# Patient Record
Sex: Female | Born: 1955 | Race: White | Hispanic: Yes | Marital: Single
Health system: Southern US, Community
[De-identification: ages and names within clinical notes are randomized; demographics above are authoritative.]

---

## 2018-02-07 ENCOUNTER — Ambulatory Visit
Admission: RE | Admit: 2018-02-07 | Discharge: 2018-02-07 | Disposition: A | Discharge status: Home / Self Care | Payer: Self-pay | Source: Ambulatory Visit | Attending: Oncology | Admitting: Oncology

## 2018-02-07 ENCOUNTER — Ambulatory Visit: Payer: Self-pay | Attending: Oncology

## 2018-02-07 VITALS — BP 137/62 | HR 74 | Temp 98.3°F | Ht <= 58 in | Wt 134.0 lb

## 2018-02-07 DIAGNOSIS — Z0000 Encounter for general adult medical examination without abnormal findings: Secondary | ICD-10-CM | POA: Insufficient documentation

## 2018-02-07 NOTE — Progress Notes (Signed)
  Subjective:     Patient ID: Stacey Fleming, female   DOB: 1955/11/13, 62 y.o.   MRN: [id number]  HPI   Review of Systems     Objective:   Physical Exam  Pulmonary/Chest: Right breast exhibits no inverted nipple, no mass, no nipple discharge, no skin change and no tenderness. Left breast exhibits no inverted nipple, no mass, no nipple discharge, no skin change and no tenderness. Breasts are symmetrical.  Scar left breast from coffee spill; large pendulous breasts         Assessment:     62 year old hispanic patient presents for [facility] clinic visit. Maritza Afanador interpreted exam.  Had mammogram, and pap in [place] last year while staying with daughter.  Patient screened, and meets [facility] eligibility.  Patient does not have insurance, Medicare or Medicaid.  Handout given on Affordable Care Act. Instructed patient on breast self awareness using teach back method.  Clinical breast exam unremarkable.  No mass or lump palpated.     Plan:     Sent for bilateral screening mammogram.

## 2018-03-29 ENCOUNTER — Other Ambulatory Visit: Payer: Self-pay | Admitting: *Deleted

## 2018-03-29 DIAGNOSIS — N63 Unspecified lump in unspecified breast: Secondary | ICD-10-CM

## 2018-04-05 ENCOUNTER — Ambulatory Visit
Admission: RE | Admit: 2018-04-05 | Discharge: 2018-04-05 | Disposition: A | Discharge status: Home / Self Care | Payer: Self-pay | Source: Ambulatory Visit | Attending: Oncology | Admitting: Oncology

## 2018-04-05 DIAGNOSIS — N63 Unspecified lump in unspecified breast: Secondary | ICD-10-CM | POA: Insufficient documentation

## 2018-04-13 ENCOUNTER — Other Ambulatory Visit: Payer: Self-pay

## 2018-04-13 DIAGNOSIS — N63 Unspecified lump in unspecified breast: Secondary | ICD-10-CM

## 2018-04-17 NOTE — Progress Notes (Signed)
Risk Assessment    No risk assessment data for the current encounter   Risk Scores      02/07/2018   Last edited by: Jim LikeLambert, Sheena M, RN   5-year risk: 3.3 %   Lifetime risk: 13.6 %          Radiologist recommends 6 month follow-up mammogram, and ultrasound of left breast.  Patient scheduled for 10/08/18 at 11:00.  Mailed appointment information to patient Copy to [facility].

## 2018-10-08 ENCOUNTER — Other Ambulatory Visit: Payer: Self-pay

## 2018-10-12 ENCOUNTER — Ambulatory Visit
Admission: RE | Admit: 2018-10-12 | Discharge: 2018-10-12 | Disposition: A | Discharge status: Home / Self Care | Payer: Medicaid Other | Source: Ambulatory Visit | Attending: Oncology | Admitting: Oncology

## 2018-10-12 ENCOUNTER — Encounter: Payer: Self-pay | Admitting: Radiology

## 2018-10-12 DIAGNOSIS — N63 Unspecified lump in unspecified breast: Secondary | ICD-10-CM

## 2018-10-17 ENCOUNTER — Other Ambulatory Visit: Payer: Self-pay | Admitting: Family Medicine

## 2018-10-17 DIAGNOSIS — M810 Age-related osteoporosis without current pathological fracture: Secondary | ICD-10-CM

## 2018-11-14 ENCOUNTER — Encounter: Payer: Self-pay | Admitting: *Deleted

## 2018-11-14 ENCOUNTER — Other Ambulatory Visit: Payer: Self-pay | Admitting: Family Medicine

## 2018-11-14 NOTE — Progress Notes (Signed)
Stacey Fleming notified patient's primary care that they would need to order next 6 month follow up mammogram since patient is now 8 and has Medicaid.  Dr. Bubba Camp office is in agreement.

## 2018-11-26 ENCOUNTER — Other Ambulatory Visit: Payer: Medicaid Other

## 2019-03-04 ENCOUNTER — Other Ambulatory Visit: Payer: Self-pay | Admitting: Family Medicine

## 2019-03-04 DIAGNOSIS — Z1231 Encounter for screening mammogram for malignant neoplasm of breast: Secondary | ICD-10-CM

## 2019-05-16 ENCOUNTER — Other Ambulatory Visit: Payer: Self-pay | Admitting: Family Medicine

## 2019-05-16 DIAGNOSIS — R928 Other abnormal and inconclusive findings on diagnostic imaging of breast: Secondary | ICD-10-CM

## 2019-07-16 IMAGING — MG MM DIGITAL DIAGNOSTIC UNILAT*L* W/ TOMO W/ CAD
4 series · 4 of 12 positions shown · non-contrast
Comparison: Previous exam(s).

CLINICAL DATA: 62-year-old female recalled from baseline screening
mammogram dated 02/07/2018 for left breast masses.

EXAM:
DIGITAL DIAGNOSTIC LEFT MAMMOGRAM WITH CAD AND TOMO
ULTRASOUND LEFT BREAST

[L CC synth-2D]
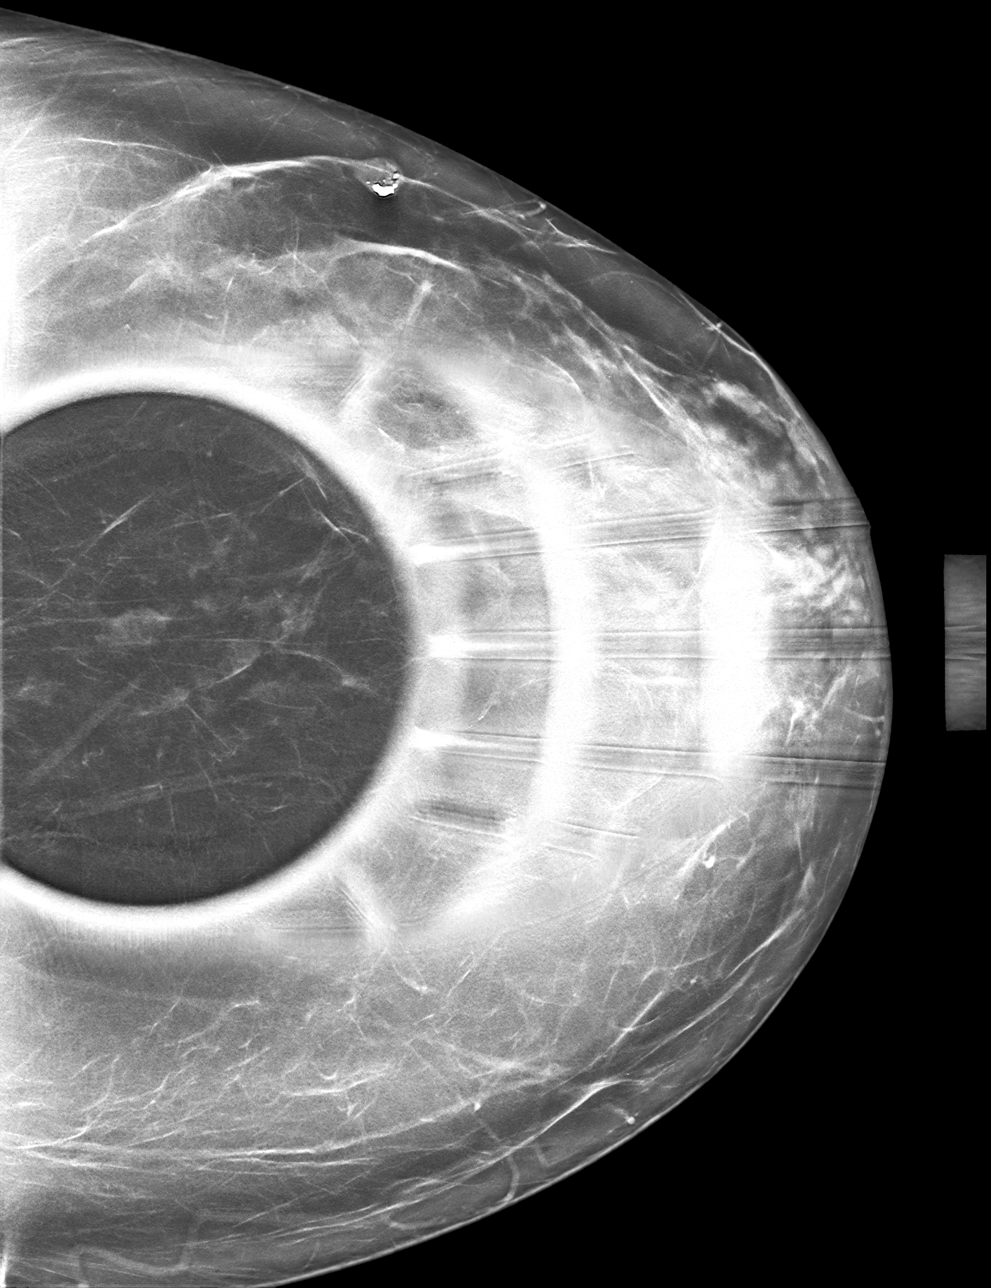

[L MLO synth-2D]
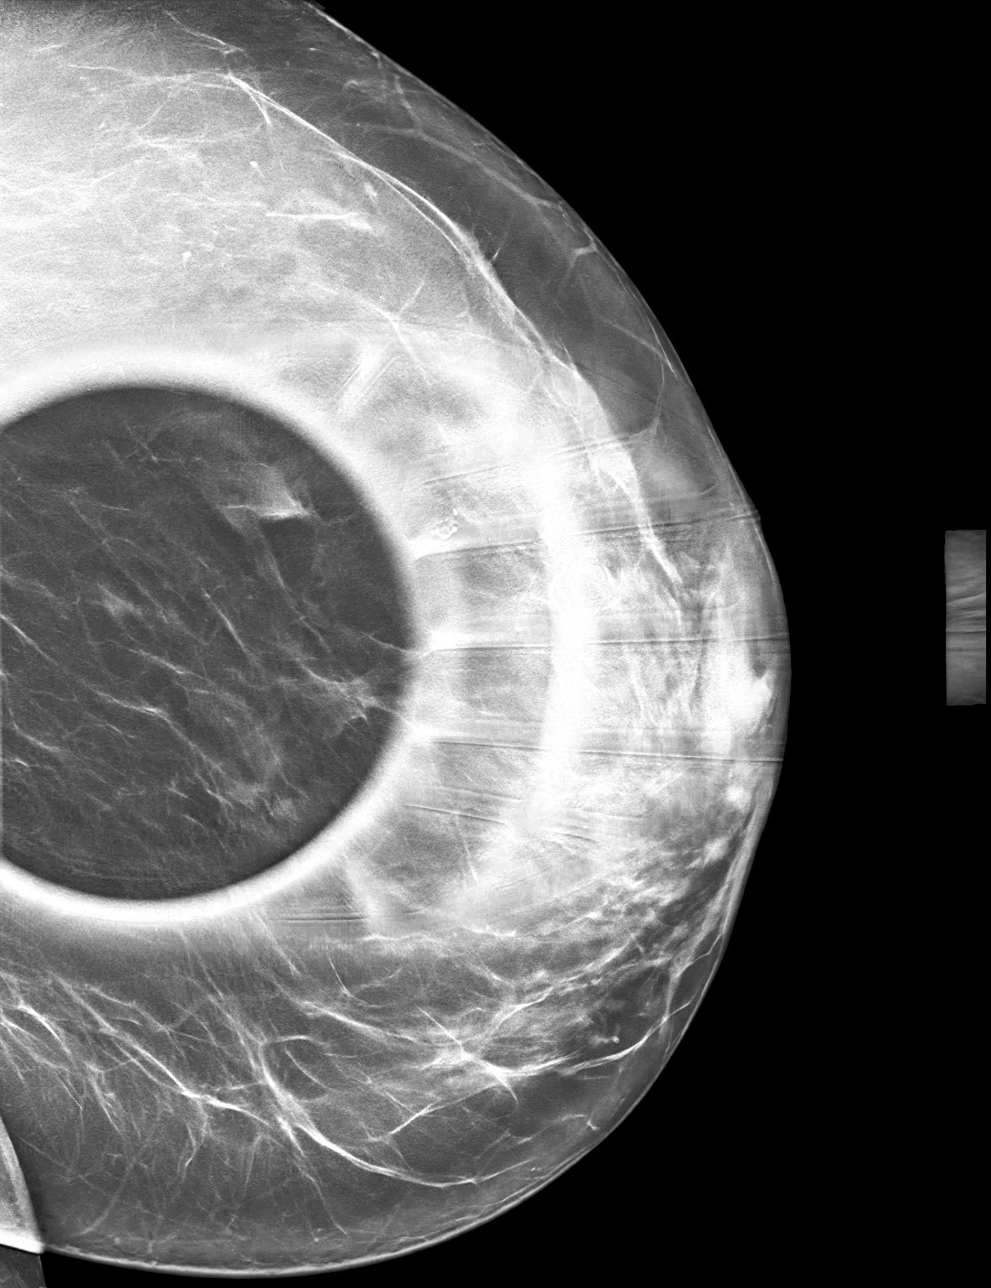

[L CC tomo · tomo slice 35/68.0]
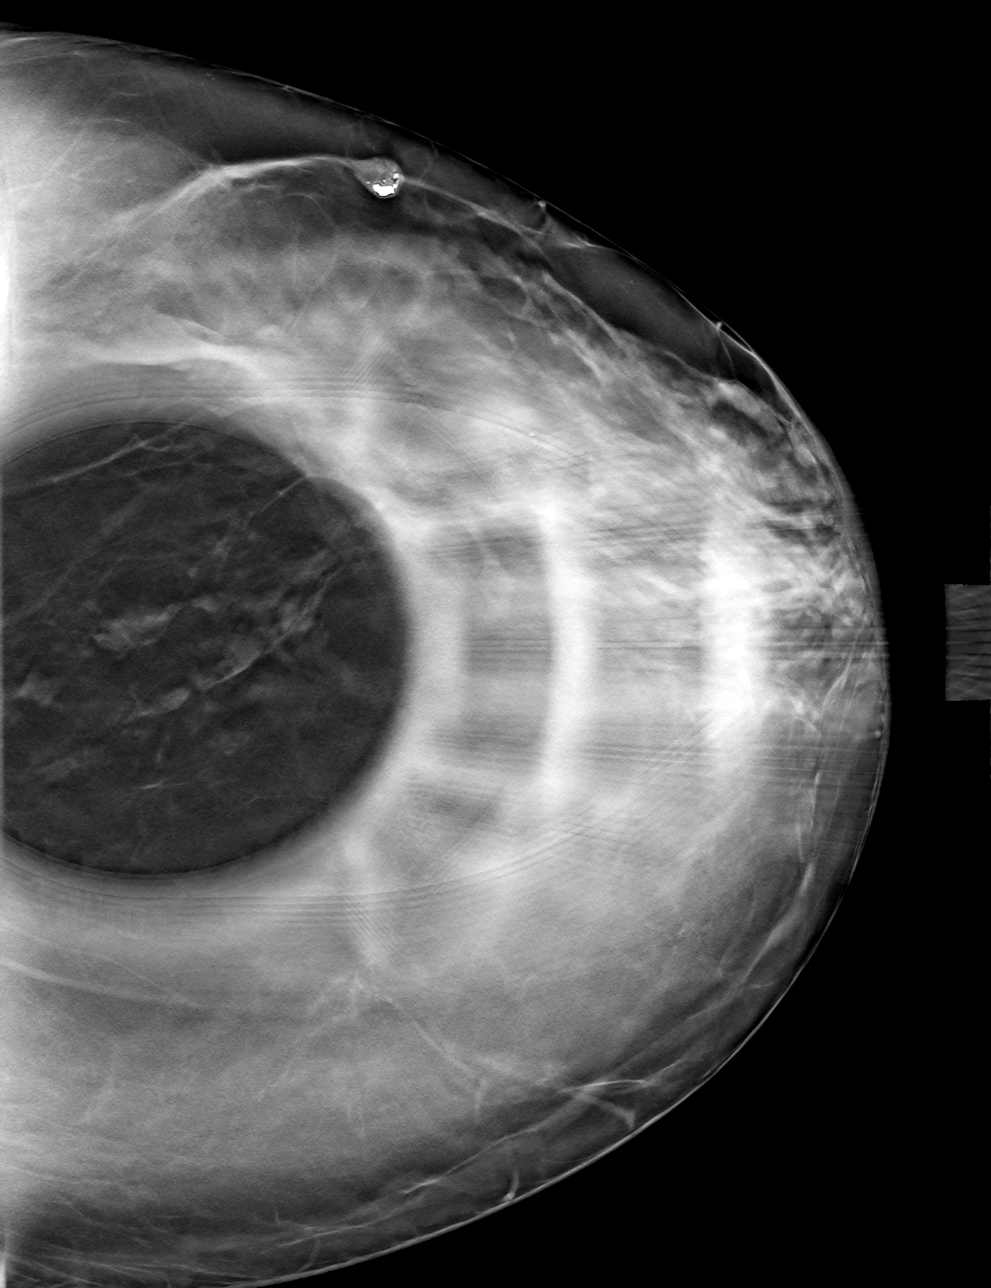

[L MLO tomo · tomo slice 35/69.0]
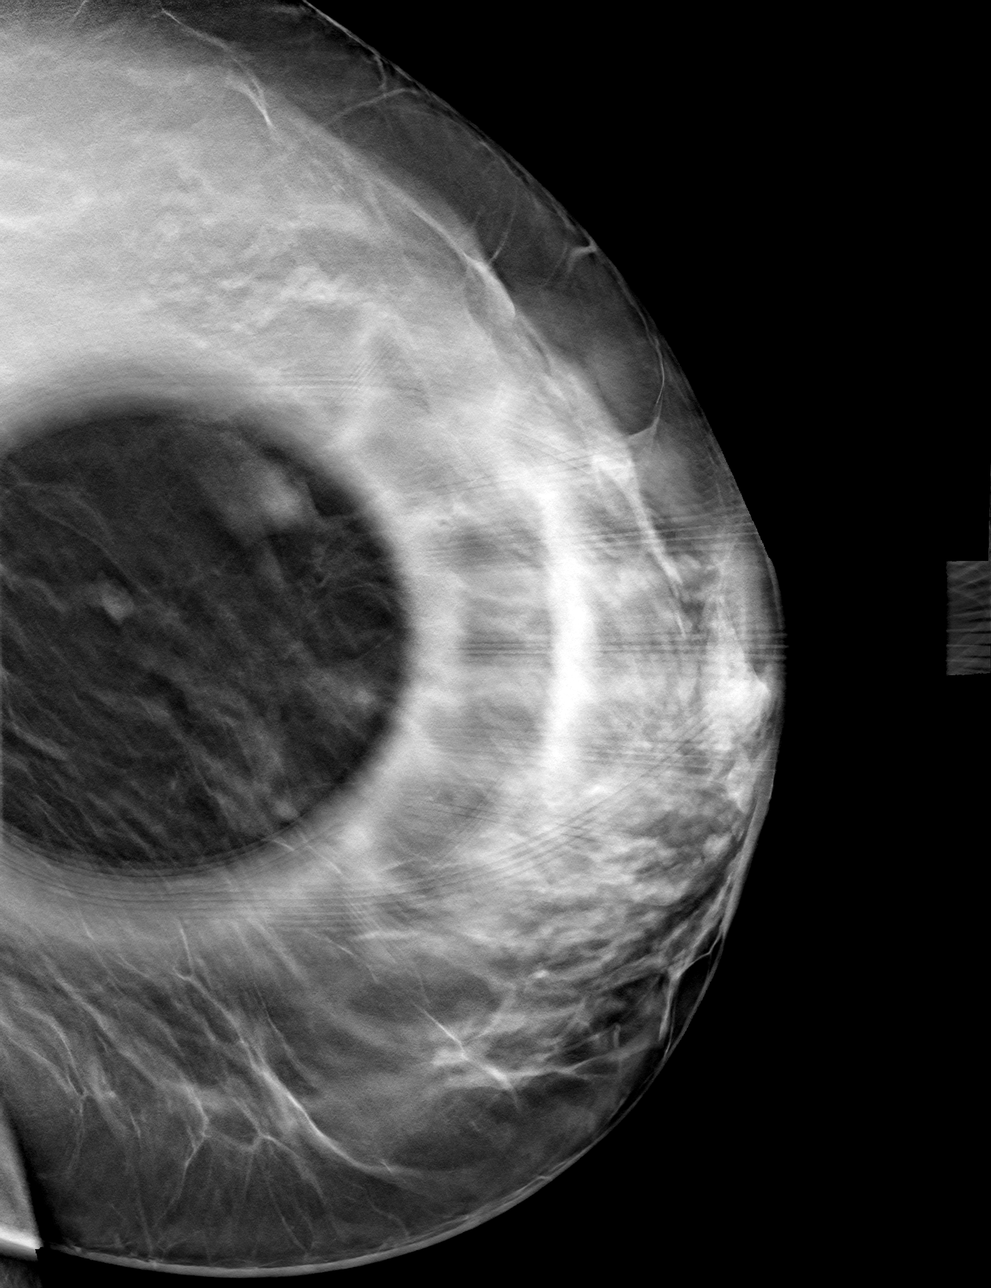

[4 of 12 positions shown; findings below may reference images not displayed]

ACR Breast Density Category b: There are scattered areas of
fibroglandular density.
FINDINGS: Circumscribed masses in a linear configuration are again identified
in the central left breast at posterior depth. Further evaluation
with ultrasound was performed.

Mammographic images were processed with CAD.

Targeted ultrasound is performed, showing multiple dilated ducts
with areas of focal outpouching in the deep subareolar left breast.
This may account for the mammographic findings.
IMPRESSION: Multiple dilated ducts with areas of focal outpouching in the deep
subareolar left breast. This may account for the screening
mammographic findings. Upon further discussion, the patient states
she was told something similar when she had a prior diagnostic
mammogram in [place]. She is unsure of the date of this exam.

RECOMMENDATION:
1. Recommendation is for six-month mammographic and sonographic
follow-up for the patient's probably benign left breast findings.
2. If prior imaging can be obtained and compared, an addendum will
be issued.

I have discussed the findings and recommendations with the patient.
Results were also provided in writing at the conclusion of the
visit. If applicable, a reminder letter will be sent to the patient
regarding the next appointment.

BI-RADS CATEGORY  3: Probably benign.

## 2019-07-18 ENCOUNTER — Other Ambulatory Visit: Payer: Self-pay

## 2019-07-18 DIAGNOSIS — Z20822 Contact with and (suspected) exposure to covid-19: Secondary | ICD-10-CM

## 2019-07-21 LAB — NOVEL CORONAVIRUS, NAA: SARS-CoV-2, NAA: NOT DETECTED

## 2019-08-05 ENCOUNTER — Ambulatory Visit
Admission: RE | Admit: 2019-08-05 | Discharge: 2019-08-05 | Disposition: A | Discharge status: Home / Self Care | Payer: Medicare Other | Source: Ambulatory Visit | Attending: Family Medicine | Admitting: Family Medicine

## 2019-08-05 DIAGNOSIS — R928 Other abnormal and inconclusive findings on diagnostic imaging of breast: Secondary | ICD-10-CM | POA: Diagnosis not present

## 2020-01-22 IMAGING — US ULTRASOUND LEFT BREAST LIMITED
1 series · 6 of 6 positions shown · non-contrast
Comparison: Prior mammograms dated 02/07/2018 and 04/05/2018.

CLINICAL DATA: Short-term interval follow-up of probable benign
findings in the left breast.

EXAM:
DIGITAL DIAGNOSTIC LEFT MAMMOGRAM WITH CAD AND TOMO
ULTRASOUND LEFT BREAST

[Series 1: ultrasound left breast limited · 0.06mm/px · 6 of 6 slices shown]
[im 1/6]
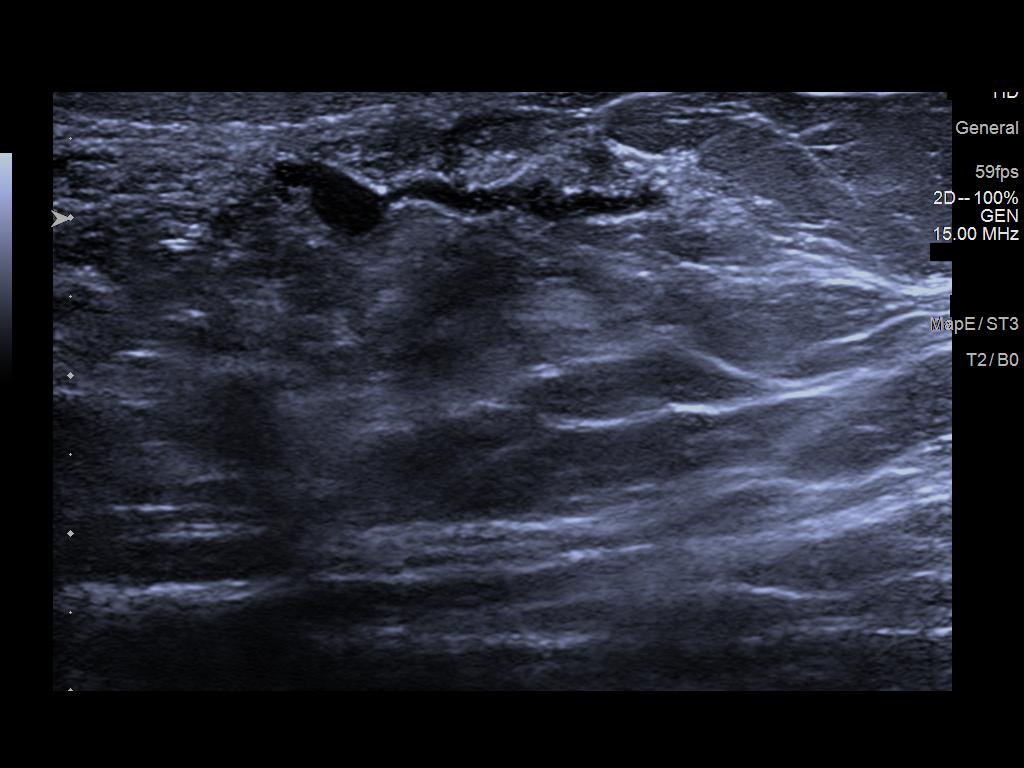
[im 2/6]
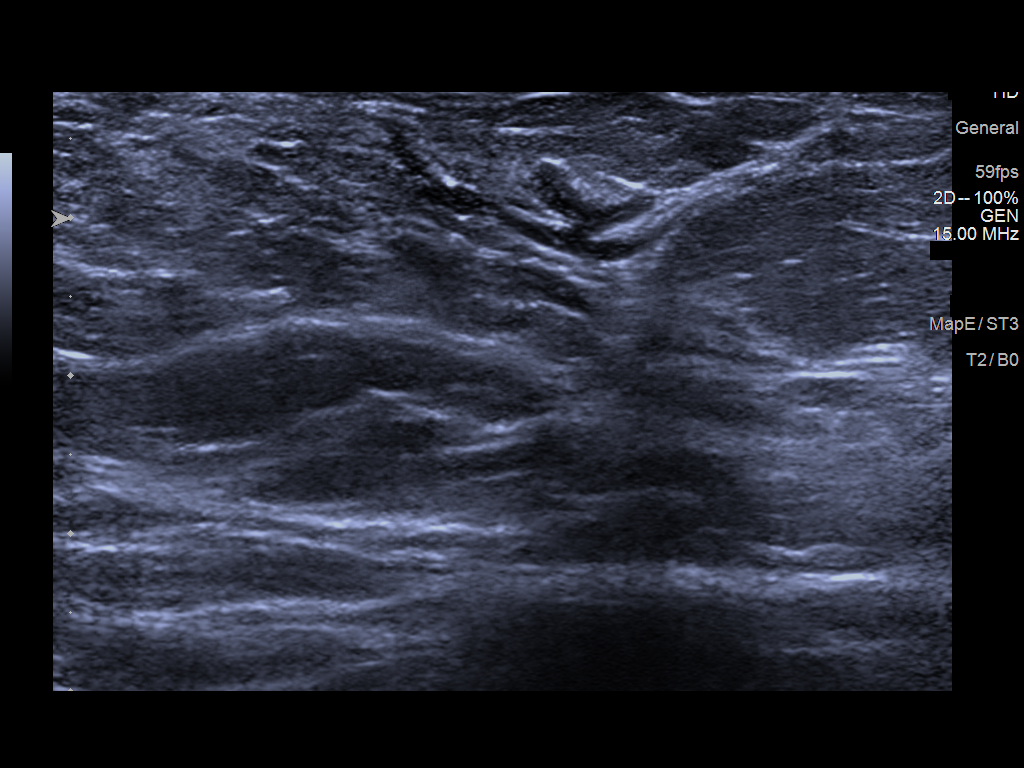
[im 3/6]
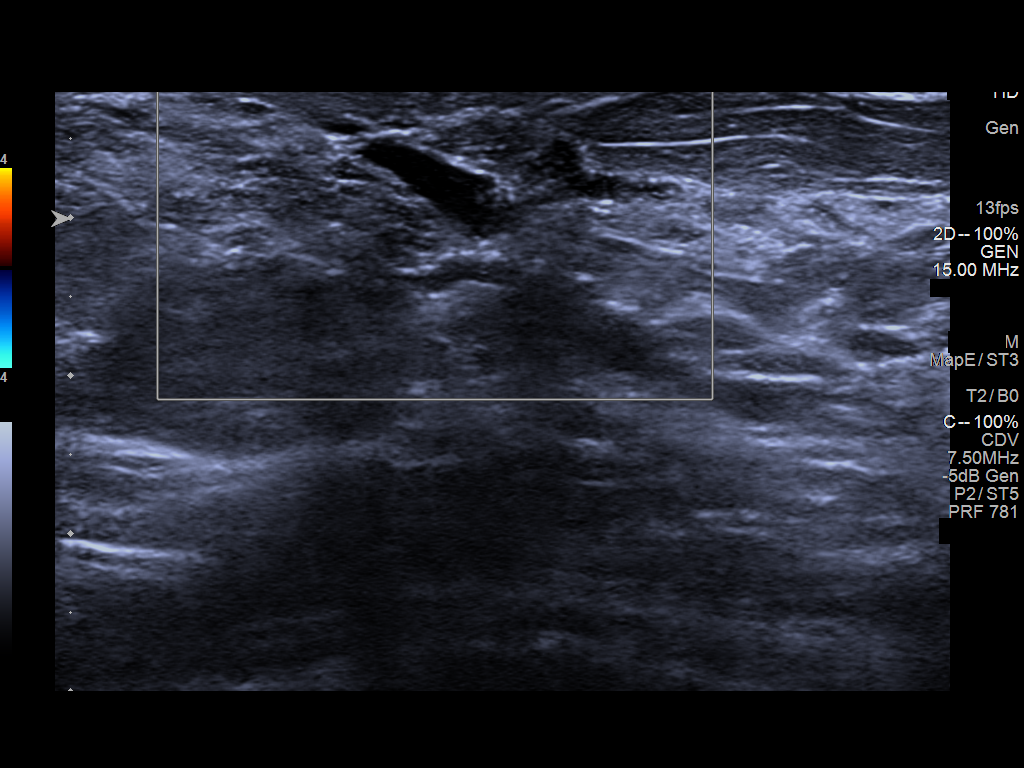
[im 4/6]
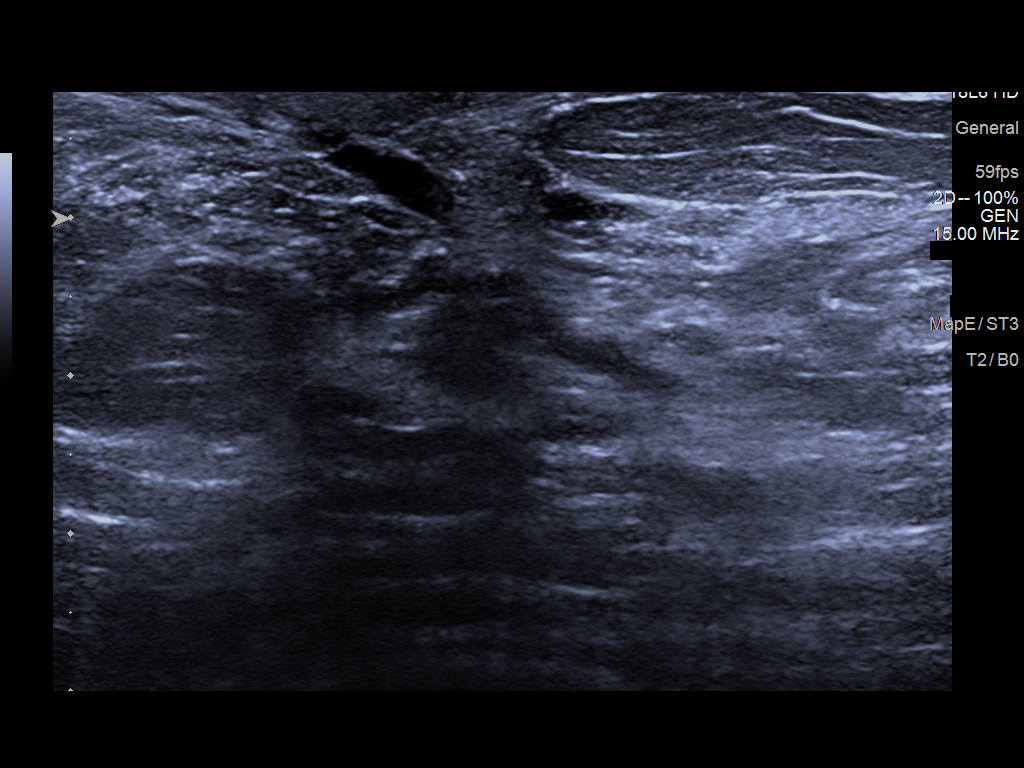
[im 5/6]
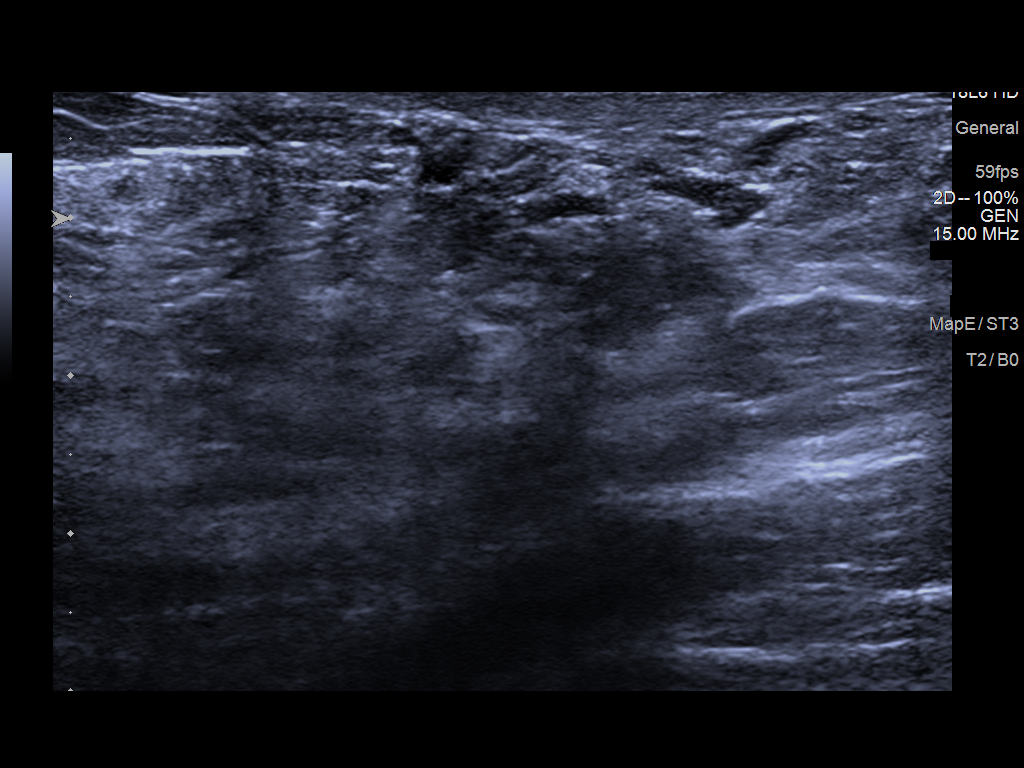
[im 6/6]
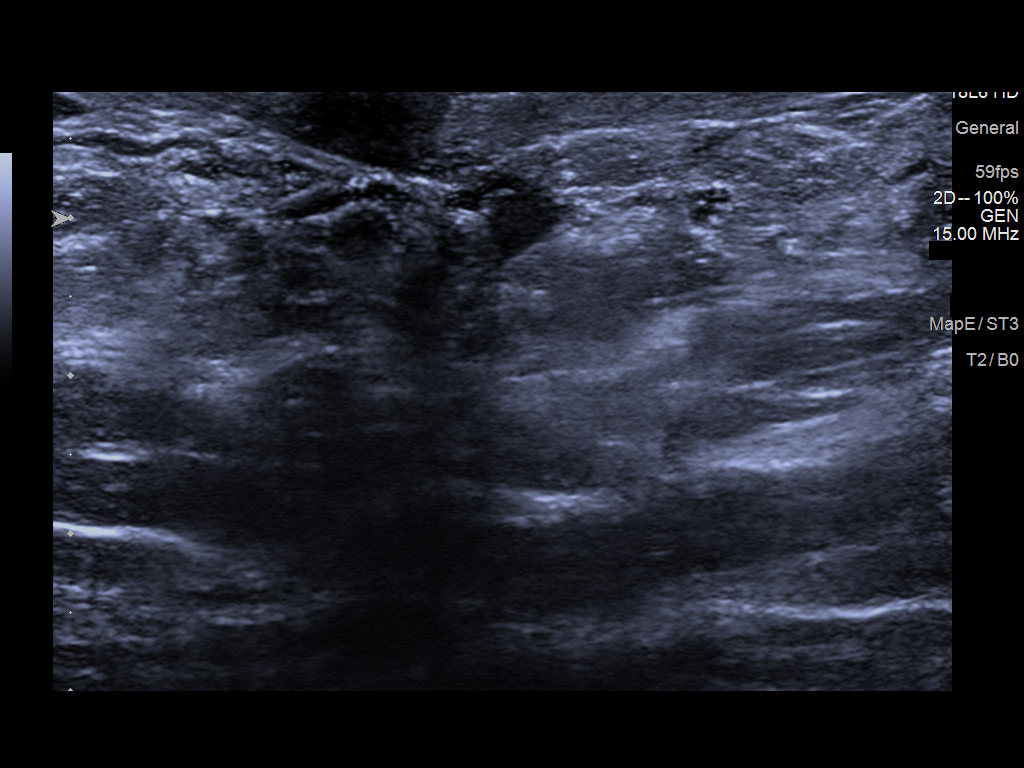

[6 of 6 positions shown; findings below may reference images not displayed]

ACR Breast Density Category b: There are scattered areas of
fibroglandular density.
FINDINGS: Nodularity seen in the upper left breast is stable compared to prior
exams. No suspicious mass or malignant type microcalcifications
identified.

Mammographic images were processed with CAD.

Targeted ultrasound is performed, showing stable dilated ducts in
the subareolar region of the left breast. No intraductal mass,
suspicious mass, abnormal shadowing or distortion visualized.
IMPRESSION: Stable appearance of the left breast.

RECOMMENDATION:
Bilateral diagnostic mammogram in Monday January, 2019 is recommended.

I have discussed the findings and recommendations with the patient.
Results were also provided in writing at the conclusion of the
visit. If applicable, a reminder letter will be sent to the patient
regarding the next appointment.

BI-RADS CATEGORY  3: Probably benign.
